# Patient Record
Sex: Male | Born: 1995 | Hispanic: Yes | Marital: Single | Smoking: Never smoker
Health system: Southern US, Community
[De-identification: ages and names within clinical notes are randomized; demographics above are authoritative.]

---

## 2019-01-23 ENCOUNTER — Emergency Department (HOSPITAL_COMMUNITY)
Admission: EM | Admit: 2019-01-23 | Discharge: 2019-01-24 | Disposition: A | Discharge status: Home / Self Care | Payer: Self-pay | Attending: Emergency Medicine | Admitting: Emergency Medicine

## 2019-01-23 ENCOUNTER — Encounter (HOSPITAL_COMMUNITY): Payer: Self-pay | Admitting: Emergency Medicine

## 2019-01-23 DIAGNOSIS — S62001A Unspecified fracture of navicular [scaphoid] bone of right wrist, initial encounter for closed fracture: Secondary | ICD-10-CM | POA: Insufficient documentation

## 2019-01-23 DIAGNOSIS — Y9389 Activity, other specified: Secondary | ICD-10-CM | POA: Insufficient documentation

## 2019-01-23 DIAGNOSIS — Z23 Encounter for immunization: Secondary | ICD-10-CM | POA: Insufficient documentation

## 2019-01-23 DIAGNOSIS — S0181XA Laceration without foreign body of other part of head, initial encounter: Secondary | ICD-10-CM

## 2019-01-23 DIAGNOSIS — S01111A Laceration without foreign body of right eyelid and periocular area, initial encounter: Secondary | ICD-10-CM | POA: Insufficient documentation

## 2019-01-23 DIAGNOSIS — Y9289 Other specified places as the place of occurrence of the external cause: Secondary | ICD-10-CM | POA: Insufficient documentation

## 2019-01-23 DIAGNOSIS — Y999 Unspecified external cause status: Secondary | ICD-10-CM | POA: Insufficient documentation

## 2019-01-23 NOTE — ED Notes (Signed)
[employer] at bedside 
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
## 2019-01-24 ENCOUNTER — Emergency Department (HOSPITAL_COMMUNITY): Payer: Self-pay

## 2019-01-24 MED ORDER — HYDROCODONE-ACETAMINOPHEN 5-325 MG PO TABS: 1.0000 | ORAL_TABLET | Freq: Four times a day (QID) | ORAL | 0 refills | Status: AC | PRN

## 2019-01-24 MED ORDER — HYDROCODONE-ACETAMINOPHEN 5-325 MG PO TABS
2.0000 | ORAL_TABLET | Freq: Once | ORAL | Status: AC
  Administered 2019-01-24: 2 via ORAL
  Filled 2019-01-24: qty 2

## 2019-01-24 MED ORDER — TETANUS-DIPHTH-ACELL PERTUSSIS 5-2.5-18.5 LF-MCG/0.5 IM SUSP
0.5000 mL | Freq: Once | INTRAMUSCULAR | Status: AC
  Administered 2019-01-24: 0.5 mL via INTRAMUSCULAR
  Filled 2019-01-24: qty 0.5

## 2019-01-24 MED ORDER — LIDOCAINE-EPINEPHRINE (PF) 2 %-1:200000 IJ SOLN
10.0000 mL | Freq: Once | INTRAMUSCULAR | Status: AC
  Administered 2019-01-24: 10 mL
  Filled 2019-01-24: qty 10
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
## 2019-01-24 NOTE — Discharge Instructions (Signed)
You need to call the hand doctor.  The phone number is listed.    Your sutures will fall out in about a week.  Keep the wound clean and dry.

## 2019-01-24 NOTE — Progress Notes (Signed)
Orthopedic Tech Progress Note Patient Details:  Davaris Youtsey 1995-06-22 [id number]  Ortho Devices Type of Ortho Device: Arm sling, Thumb spica splint Splint Material: Fiberglass Ortho Device/Splint Location: rue Ortho Device/Splint Interventions: Ordered, Application, Adjustment   Post Interventions Patient Tolerated: Well Instructions Provided: Care of device, Adjustment of device   Karolee Stamps 01/24/2019, 3:53 AM

## 2019-01-24 NOTE — ED Notes (Signed)
Requested supplies at bedside 
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
